# Patient Record
Sex: Male | Born: 1995 | Race: White | Hispanic: No | Marital: Single | Smoking: Current every day smoker
Health system: Southern US, Community
[De-identification: ages and names within clinical notes are randomized; demographics above are authoritative.]

---
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
## 2017-01-01 ENCOUNTER — Encounter (HOSPITAL_COMMUNITY): Payer: Self-pay | Admitting: Emergency Medicine

## 2017-01-01 ENCOUNTER — Emergency Department (HOSPITAL_COMMUNITY)
Admission: EM | Admit: 2017-01-01 | Discharge: 2017-01-01 | Disposition: A | Discharge status: Home / Self Care | Payer: Managed Care, Other (non HMO) | Attending: Emergency Medicine | Admitting: Emergency Medicine

## 2017-01-01 DIAGNOSIS — F17200 Nicotine dependence, unspecified, uncomplicated: Secondary | ICD-10-CM | POA: Diagnosis not present

## 2017-01-01 DIAGNOSIS — Z4802 Encounter for removal of sutures: Secondary | ICD-10-CM | POA: Diagnosis present

## 2017-01-01 NOTE — ED Triage Notes (Signed)
Patient here for staple removal from head.
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
## 2017-01-01 NOTE — Discharge Instructions (Signed)
Please read and follow all provided instructions.  Your diagnoses today include:  1. Encounter for staple removal     Tests performed today include:  Vital signs. See below for your results today.   Medications prescribed:   None  Take any prescribed medications only as directed.   Home care instructions:  Follow any educational materials contained in this packet. Cover the area if it draining or weeping.   Return instructions:  Return to the Emergency Department if you have:  Fever  Worsening symptoms  Worsening pain  Worsening swelling  Redness of the skin that moves away from the affected area, especially if it streaks away from the affected area   Any other emergent concerns  Your vital signs today were: BP (!) 114/97 (BP Location: Right Arm)    Pulse (!) 125    Temp 97.7 F (36.5 C) (Oral)    Ht 5\' 9"  (1.753 m)    Wt 71.2 kg (157 lb)    SpO2 99%    BMI 23.18 kg/m  If your blood pressure (BP) was elevated above 135/85 this visit, please have this repeated by your doctor within one month. --------------

## 2017-01-01 NOTE — ED Notes (Addendum)
3 staples removed without difficulty from posterior of head.
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
